# Patient Record
Sex: Female | Born: 1995 | Race: White | Hispanic: No | Marital: Married | Smoking: Never smoker
Health system: Southern US, Community
[De-identification: ages and names within clinical notes are randomized; demographics above are authoritative.]

## PROBLEM LIST (undated history)

## (undated) DIAGNOSIS — Z800 Family history of malignant neoplasm of digestive organs: Secondary | ICD-10-CM

## (undated) DIAGNOSIS — Z23 Encounter for immunization: Secondary | ICD-10-CM

## (undated) DIAGNOSIS — G43109 Migraine with aura, not intractable, without status migrainosus: Secondary | ICD-10-CM

## (undated) DIAGNOSIS — Z803 Family history of malignant neoplasm of breast: Secondary | ICD-10-CM

## (undated) HISTORY — DX: Migraine with aura, not intractable, without status migrainosus: G43.109

## (undated) HISTORY — DX: Family history of malignant neoplasm of breast: Z80.3

## (undated) HISTORY — DX: Encounter for immunization: Z23

## (undated) HISTORY — PX: OTHER SURGICAL HISTORY: SHX169

## (undated) HISTORY — DX: Family history of malignant neoplasm of digestive organs: Z80.0

---
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
## 2020-06-13 DIAGNOSIS — S92344A Nondisplaced fracture of fourth metatarsal bone, right foot, initial encounter for closed fracture: Secondary | ICD-10-CM | POA: Diagnosis not present

## 2020-06-13 DIAGNOSIS — S93699A Other sprain of unspecified foot, initial encounter: Secondary | ICD-10-CM | POA: Diagnosis not present

## 2020-06-20 DIAGNOSIS — L858 Other specified epidermal thickening: Secondary | ICD-10-CM | POA: Diagnosis not present

## 2020-06-20 DIAGNOSIS — L218 Other seborrheic dermatitis: Secondary | ICD-10-CM | POA: Diagnosis not present

## 2020-06-20 DIAGNOSIS — D2261 Melanocytic nevi of right upper limb, including shoulder: Secondary | ICD-10-CM | POA: Diagnosis not present

## 2020-06-20 DIAGNOSIS — D225 Melanocytic nevi of trunk: Secondary | ICD-10-CM | POA: Diagnosis not present

## 2020-06-20 DIAGNOSIS — D485 Neoplasm of uncertain behavior of skin: Secondary | ICD-10-CM | POA: Diagnosis not present

## 2021-02-23 ENCOUNTER — Ambulatory Visit (INDEPENDENT_AMBULATORY_CARE_PROVIDER_SITE_OTHER): Payer: BC Managed Care – PPO | Admitting: Advanced Practice Midwife

## 2021-02-23 ENCOUNTER — Other Ambulatory Visit: Payer: Self-pay

## 2021-02-23 ENCOUNTER — Encounter: Payer: Self-pay | Admitting: Advanced Practice Midwife

## 2021-02-23 VITALS — BP 133/85 | Ht 65.0 in | Wt 172.0 lb

## 2021-02-23 DIAGNOSIS — N6323 Unspecified lump in the left breast, lower outer quadrant: Secondary | ICD-10-CM | POA: Diagnosis not present

## 2021-02-23 NOTE — Progress Notes (Signed)
   Patient ID: Rhonda Stevens, female   DOB: 07/09/1996, 25 y.o.   MRN: [id number]  Reason for Consult: Breast Mass    Subjective:  HPI:  Rhonda Stevens is a 25 y.o. female being seen for left breast lump she noticed in the past week. About 3 weeks ago she had a bruise in her upper left breast that took a while to resolve. Shortly after that she had a bruise in her lower left breast and then noticed a lump near that bruise. She has a dull/achy pain in the left breast. She denies any trauma or a change in bra. She does not have a significant family history of breast or ovarian cancer.  She is sexually active and uses condoms for birth control. She has never had a PAP smear. She did receive Gardasil vaccines. She denies concerns for STDs. Recommend she schedule an annual exam/PAP.   History reviewed. No pertinent past medical history. Family History  Problem Relation Age of Onset  . Colon cancer Maternal Grandfather   . Breast cancer Paternal Grandmother 28  . Breast cancer Maternal Aunt 55   History reviewed. No pertinent surgical history.  Short Social History:  Social History   Tobacco Use  . Smoking status: Never Smoker  . Smokeless tobacco: Never Used  Substance Use Topics  . Alcohol use: Yes    No Known Allergies  No current outpatient medications on file.   No current facility-administered medications for this visit.    Review of Systems  Constitutional: Negative for chills and fever.  HENT: Negative for congestion, ear discharge, ear pain, hearing loss, sinus pain and sore throat.   Eyes: Negative for blurred vision and double vision.  Respiratory: Negative for cough, shortness of breath and wheezing.   Cardiovascular: Negative for chest pain, palpitations and leg swelling.  Gastrointestinal: Negative for abdominal pain, blood in stool, constipation, diarrhea, heartburn, melena, nausea and vomiting.  Genitourinary: Negative for dysuria, flank pain, frequency,  hematuria and urgency.  Musculoskeletal: Negative for back pain, joint pain and myalgias.  Skin: Negative for itching and rash.  Neurological: Negative for dizziness, tingling, tremors, sensory change, speech change, focal weakness, seizures, loss of consciousness, weakness and headaches.  Endo/Heme/Allergies: Negative for environmental allergies. Does not bruise/bleed easily.  Psychiatric/Behavioral: Negative for depression, hallucinations, memory loss, substance abuse and suicidal ideas. The patient is not nervous/anxious and does not have insomnia.   Breast: left breast bruising and lump, pain      Objective:  Objective   Vitals:   02/23/21 1326  BP: 133/85  Weight: 172 lb (78 kg)  Height: 5\' 5"  (1.651 m)   Body mass index is 28.62 kg/m. Constitutional: Well nourished, well developed female in no acute distress.  HEENT: normal Skin: Warm and dry.  Cardiovascular: Regular rate and rhythm.   Breast: right breast; no masses, skin changes, tenderness/left breast; healing bruise in lower breast with ropy/knotty lump at 4:30 o'clock 6 cm from the nipple Extremity: no edema Respiratory: Clear to auscultation bilateral. Normal respiratory effort Neuro: DTRs 2+, Cranial nerves grossly intact Psych: Alert and Oriented x3. No memory deficits. Normal mood and affect.  MS: normal gait, normal bilateral lower extremity ROM/strength/stability.   Assessment/Plan:     25 y.o. G0 P0000 female with breast lump, likely benign  Mammogram/ultrasound ordered Follow up as needed Schedule annual exam with PAP smear   22 CNM [facility] 02/23/2021, 2:03 PM

## 2021-03-02 ENCOUNTER — Other Ambulatory Visit: Payer: Self-pay

## 2021-03-02 ENCOUNTER — Ambulatory Visit
Admission: RE | Admit: 2021-03-02 | Discharge: 2021-03-02 | Disposition: A | Discharge status: Home / Self Care | Payer: BC Managed Care – PPO | Source: Ambulatory Visit | Attending: Advanced Practice Midwife | Admitting: Advanced Practice Midwife

## 2021-03-02 DIAGNOSIS — N6323 Unspecified lump in the left breast, lower outer quadrant: Secondary | ICD-10-CM | POA: Diagnosis not present

## 2021-03-02 DIAGNOSIS — N6321 Unspecified lump in the left breast, upper outer quadrant: Secondary | ICD-10-CM | POA: Diagnosis not present

## 2021-03-12 DIAGNOSIS — Z1371 Encounter for nonprocreative screening for genetic disease carrier status: Secondary | ICD-10-CM

## 2021-03-12 DIAGNOSIS — Z9189 Other specified personal risk factors, not elsewhere classified: Secondary | ICD-10-CM

## 2021-03-12 HISTORY — DX: Encounter for nonprocreative screening for genetic disease carrier status: Z13.71

## 2021-03-12 HISTORY — DX: Other specified personal risk factors, not elsewhere classified: Z91.89

## 2021-03-24 NOTE — Progress Notes (Signed)
PCP:  Pcp, No   Chief Complaint  Patient presents with  . Gynecologic Exam    No concerns     HPI:      [name] is a 25 y.o. No obstetric history on file. whose LMP was Patient's last menstrual period was 03/12/2021 (exact date)., presents today for her annual examination.  Her menses are regular every 30-36 days, lasting 5 days.  Dysmenorrhea mild, occurring first 1-2 days of flow. She does not have intermenstrual bleeding.  Sex activity: single partner, contraception - condoms every time. May be interested in [facility], has not done it before. Hx of migraines with aura; no hx of HTV, DVTs. Last Pap: never Hx of STDs: none  Had LT breast pain and lump 4/22 with bruising, s/p breast u/s with resolving hematoma, repeat due in 3 months There is a FH of breast cancer in her mat aunt and PGM, as well as pancreatic cancer in her MGF.  Genetic testing not done. There is no FH of ovarian cancer. The patient does do self-breast exams.  Tobacco use: The patient denies current or previous tobacco use. Alcohol use: social drinker No drug use.  Exercise: moderately active  She does get adequate calcium and Vitamin D in her diet. Gardasil completed  Past Medical History:  Diagnosis Date  . Migraine with aura   . Vaccine for human papilloma virus (HPV) types 6, 11, 16, and 18 administered     Past Surgical History:  Procedure Laterality Date  . OTHER SURGICAL HISTORY     tooth implant    Family History  Problem Relation Age of Onset  . Pancreatic cancer Maternal Grandfather 12  . Breast cancer Paternal Grandmother 42  . Breast cancer Maternal Aunt 61    Social History   Socioeconomic History  . Marital status: Single    Spouse name: Not on file  . Number of children: Not on file  . Years of education: Not on file  . Highest education level: Not on file  Occupational History  . Not on file  Tobacco Use  . Smoking status: Never Smoker  . Smokeless tobacco: Never  Used  Vaping Use  . Vaping Use: Never used  Substance and Sexual Activity  . Alcohol use: Yes  . Drug use: Never  . Sexual activity: Yes    Birth control/protection: None, Condom  Other Topics Concern  . Not on file  Social History Narrative  . Not on file   Social Determinants of Health   Financial Resource Strain: Not on file  Food Insecurity: Not on file  Transportation Needs: Not on file  Physical Activity: Not on file  Stress: Not on file  Social Connections: Not on file  Intimate Partner Violence: Not on file    No current outpatient medications on file.     ROS:  Review of Systems  Constitutional: Negative for fatigue, fever and unexpected weight change.  Respiratory: Negative for cough, shortness of breath and wheezing.   Cardiovascular: Negative for chest pain, palpitations and leg swelling.  Gastrointestinal: Negative for blood in stool, constipation, diarrhea, nausea and vomiting.  Endocrine: Negative for cold intolerance, heat intolerance and polyuria.  Genitourinary: Negative for dyspareunia, dysuria, flank pain, frequency, genital sores, hematuria, menstrual problem, pelvic pain, urgency, vaginal bleeding, vaginal discharge and vaginal pain.  Musculoskeletal: Negative for back pain, joint swelling and myalgias.  Skin: Negative for rash.  Neurological: Positive for headaches. Negative for dizziness, syncope, light-headedness and numbness.  Hematological: Negative for adenopathy.  Psychiatric/Behavioral: Negative for agitation, confusion, sleep disturbance and suicidal ideas. The patient is not nervous/anxious.   BREAST: No symptoms   Objective: BP 104/76   Ht 5' 5" (1.651 m)   Wt 176 lb (79.8 kg)   LMP 03/12/2021 (Exact Date)   BMI 29.29 kg/m    Physical Exam Constitutional:      Appearance: She is well-developed.  Genitourinary:     Vulva normal.     Right Labia: No rash, tenderness or lesions.    Left Labia: No tenderness, lesions or rash.     No vaginal discharge, erythema or tenderness.      Right Adnexa: not tender and no mass present.    Left Adnexa: not tender and no mass present.    No cervical friability or polyp.     Uterus is not enlarged or tender.  Breasts:     Right: No mass, nipple discharge, skin change or tenderness.     Left: No mass, nipple discharge, skin change or tenderness.    Neck:     Thyroid: No thyromegaly.  Cardiovascular:     Rate and Rhythm: Normal rate and regular rhythm.     Heart sounds: Normal heart sounds. No murmur heard.   Pulmonary:     Effort: Pulmonary effort is normal.     Breath sounds: Normal breath sounds.  Abdominal:     Palpations: Abdomen is soft.     Tenderness: There is no abdominal tenderness. There is no guarding or rebound.  Musculoskeletal:        General: Normal range of motion.     Cervical back: Normal range of motion.  Lymphadenopathy:     Cervical: No cervical adenopathy.  Neurological:     General: No focal deficit present.     Mental Status: She is alert and oriented to person, place, and time.     Cranial Nerves: No cranial nerve deficit.  Skin:    General: Skin is warm and dry.  Psychiatric:        Mood and Affect: Mood normal.        Behavior: Behavior normal.        Thought Content: Thought content normal.        Judgment: Judgment normal.  Vitals reviewed.     Assessment/Plan: Encounter for annual routine gynecological examination  Cervical cancer screening - Plan: Cytology - PAP  Screening for STD (sexually transmitted disease) - Plan: Cytology - PAP  Encounter for other general counseling or advice on contraception--prog only options discussed. Pt may be interested in depo vs nexplanon. Pros/cons discussed. Pt to call if decides on one.   Breast lump on left side at 4 o'clock position - Plan: US BREAST LTD UNI LEFT INC AXILLA; sx resolved. Due for f/u u/s 7/22. Order placed, pt to sched  Family history of breast cancer - Plan:  [facility] ([facility]); MyRisk testing discussed and done today, handout given. Will call with results.         GYN counsel breast self exam, adequate intake of calcium and vitamin D, diet and exercise     F/U  Return in about 1 year (around 03/27/2022), or if symptoms worsen or fail to improve.  Alicia B. Copland, PA-C 03/27/2021 9:02 AM 

## 2021-03-27 ENCOUNTER — Other Ambulatory Visit (HOSPITAL_COMMUNITY)
Admission: RE | Admit: 2021-03-27 | Discharge: 2021-03-27 | Disposition: A | Discharge status: Home / Self Care | Payer: BC Managed Care – PPO | Source: Ambulatory Visit | Attending: Obstetrics and Gynecology | Admitting: Obstetrics and Gynecology

## 2021-03-27 ENCOUNTER — Ambulatory Visit (INDEPENDENT_AMBULATORY_CARE_PROVIDER_SITE_OTHER): Payer: BC Managed Care – PPO | Admitting: Obstetrics and Gynecology

## 2021-03-27 ENCOUNTER — Encounter: Payer: Self-pay | Admitting: Obstetrics and Gynecology

## 2021-03-27 ENCOUNTER — Other Ambulatory Visit: Payer: Self-pay

## 2021-03-27 VITALS — BP 104/76 | Ht 65.0 in | Wt 176.0 lb

## 2021-03-27 DIAGNOSIS — Z113 Encounter for screening for infections with a predominantly sexual mode of transmission: Secondary | ICD-10-CM

## 2021-03-27 DIAGNOSIS — Z124 Encounter for screening for malignant neoplasm of cervix: Secondary | ICD-10-CM | POA: Insufficient documentation

## 2021-03-27 DIAGNOSIS — N6323 Unspecified lump in the left breast, lower outer quadrant: Secondary | ICD-10-CM

## 2021-03-27 DIAGNOSIS — Z3009 Encounter for other general counseling and advice on contraception: Secondary | ICD-10-CM

## 2021-03-27 DIAGNOSIS — Z803 Family history of malignant neoplasm of breast: Secondary | ICD-10-CM

## 2021-03-27 DIAGNOSIS — Z01419 Encounter for gynecological examination (general) (routine) without abnormal findings: Secondary | ICD-10-CM

## 2021-03-27 DIAGNOSIS — Z808 Family history of malignant neoplasm of other organs or systems: Secondary | ICD-10-CM | POA: Diagnosis not present

## 2021-03-27 NOTE — Patient Instructions (Signed)
I value your feedback and you entrusting us with your care. If you get a [facility] patient survey, I would appreciate you taking the time to let us know about your experience today. Thank you!  [facility]: [phone]      

## 2021-03-30 LAB — CYTOLOGY - PAP
Chlamydia: NEGATIVE
Comment: NEGATIVE
Comment: NORMAL
Diagnosis: NEGATIVE
Neisseria Gonorrhea: NEGATIVE

## 2021-04-11 ENCOUNTER — Encounter: Payer: Self-pay | Admitting: Obstetrics and Gynecology

## 2021-04-13 ENCOUNTER — Telehealth: Payer: Self-pay | Admitting: Obstetrics and Gynecology

## 2021-04-13 ENCOUNTER — Encounter: Payer: Self-pay | Admitting: Obstetrics and Gynecology

## 2021-04-13 NOTE — Telephone Encounter (Signed)
Pt aware of neg MyRisk results except SDHA VUS.  IBIS=27.0%/riskscore=30.6%  Pt aware of monthly SBE, yearly CBE, and mammos to start age 25, as well as scr breast MRI. Will re-eval TC/riskscore models at age 8.   Patient understands these results only apply to her and her children, and this is not indicative of genetic testing results of her other family members. It is recommended that her other family members have genetic testing done.  Pt also understands negative genetic testing doesn't mean she will never get any of these cancers.   Hard copy mailed to pt. F/u prn.

## 2021-06-06 ENCOUNTER — Other Ambulatory Visit: Payer: BC Managed Care – PPO

## 2021-07-21 ENCOUNTER — Telehealth: Payer: Self-pay

## 2021-07-21 NOTE — Telephone Encounter (Signed)
Patient is scheduled for nexplanon placement on 07/24/21 with ABC

## 2021-07-24 ENCOUNTER — Ambulatory Visit (INDEPENDENT_AMBULATORY_CARE_PROVIDER_SITE_OTHER): Payer: BC Managed Care – PPO | Admitting: Obstetrics and Gynecology

## 2021-07-24 ENCOUNTER — Other Ambulatory Visit: Payer: Self-pay

## 2021-07-24 ENCOUNTER — Encounter: Payer: Self-pay | Admitting: Obstetrics and Gynecology

## 2021-07-24 VITALS — BP 130/80 | Ht 65.0 in | Wt 178.0 lb

## 2021-07-24 DIAGNOSIS — Z30017 Encounter for initial prescription of implantable subdermal contraceptive: Secondary | ICD-10-CM | POA: Diagnosis not present

## 2021-07-24 MED ORDER — ETONOGESTREL 68 MG ~~LOC~~ IMPL: 1.0000 | DRUG_IMPLANT | Freq: Once | SUBCUTANEOUS | 0 refills | Status: DC

## 2021-07-24 NOTE — Progress Notes (Signed)
   Chief Complaint  Patient presents with   Nexplanon insertion     HPI:  Rhonda Stevens is a 25 y.o. G0P0000 here for Nexplanon insertion for [facility]. Hx of migraines with aura.   BP 130/80   Ht 5\' 5"  (1.651 m)   Wt 178 lb (80.7 kg)   LMP 07/21/2021 (Exact Date)   BMI 29.62 kg/m    Nexplanon Insertion  Patient given informed consent, signed copy in the chart, time out was performed.  Appropriate time out taken.  Patient's RIGHT arm was prepped and draped in the usual sterile fashion. The ruler used to measure and mark insertion area.  Pt was prepped with betadine swab and then injected with 1.0 cc of 2% lidocaine with epinephrine. Nexplanon removed form packaging,  Device confirmed in needle, then inserted full length of needle and withdrawn per handbook instructions.  Pt insertion site covered with steri-strip and a bandage.   Minimal blood loss.  Pt tolerated the procedure welL.  Assessment: Nexplanon insertion - Plan: etonogestrel (NEXPLANON) 68 MG IMPL implant   Meds ordered this encounter  Medications   etonogestrel (NEXPLANON) 68 MG IMPL implant    Sig: 1 each (68 mg total) by Subdermal route once for 1 dose.    Dispense:  1 each    Refill:  0    Order Specific Question:   Supervising Provider    Answer:   09/20/2021 Nadara Mustard    Plan:   She was told to remove the dressing in 12-24 hours, to keep the incision area dry for 24 hours and to remove the Steristrip in 2-3  days.  Notify 10-27-1993 if any signs of tenderness, redness, pain, or fevers develop.   Kimmy Totten B. Lujuana Kapler, PA-C 07/24/2021 9:58 AM

## 2021-07-24 NOTE — Patient Instructions (Signed)
I value your feedback and you entrusting us with your care. If you get a [facility] patient survey, I would appreciate you taking the time to let us know about your experience today. Thank you!  Remove the dressing in 24 hours,  keep the incision area dry for 24 hours and remove the Steristrip in 2-3  days.  Notify us if any signs of tenderness, redness, pain, or fevers develop.   

## 2021-08-16 NOTE — Telephone Encounter (Signed)
Noted. Nexplanon rcvd/charged 07/24/21

## 2022-10-25 ENCOUNTER — Ambulatory Visit: Payer: Self-pay | Admitting: Obstetrics and Gynecology

## 2022-10-31 IMAGING — US US BREAST*L* LIMITED INC AXILLA
1 series · 7 of 7 positions shown · non-contrast
Comparison: Previous exam(s).

CLINICAL DATA: Left breast area of bruising and palpable concern
noted by the patient.

EXAM:
ULTRASOUND OF THE LEFT BREAST

[Series 1: us breast*left* limited inc axilla · 0.06mm/px · 7 of 7 slices shown]
[im 1/7]
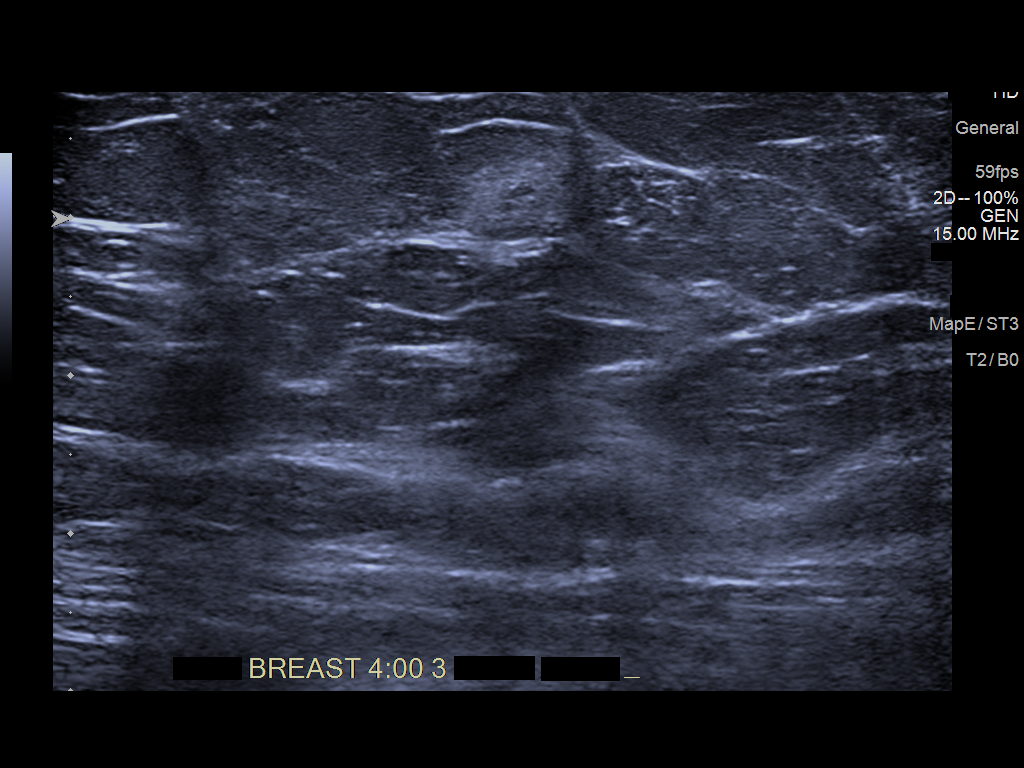
[im 2/7]
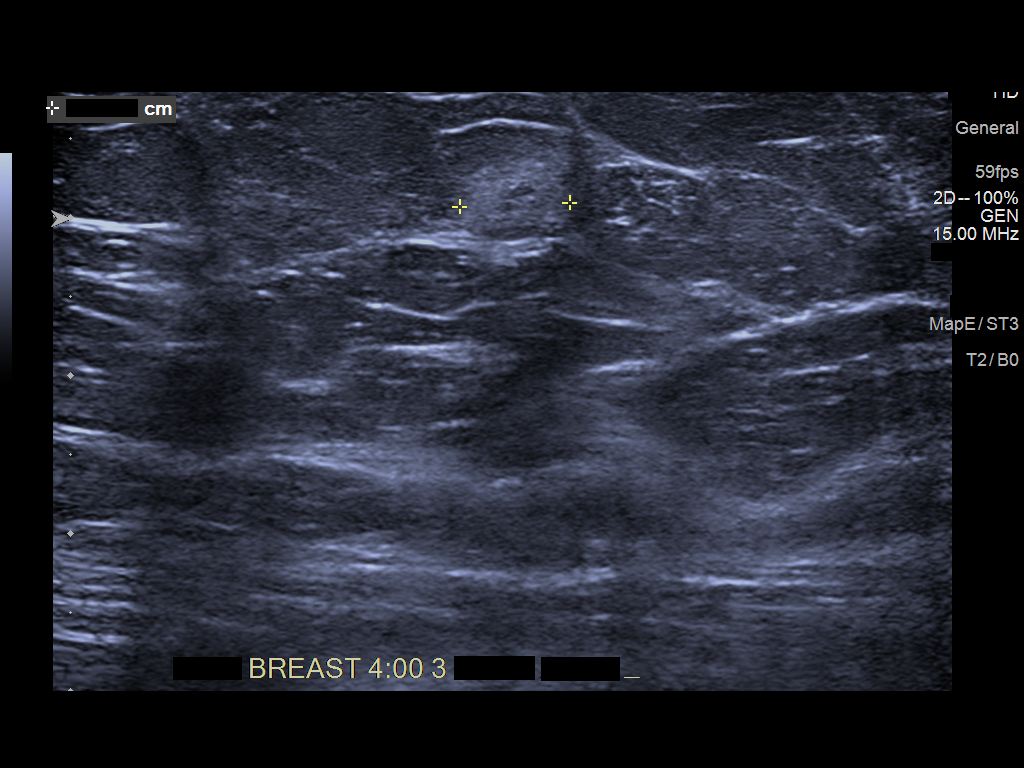
[im 3/7]
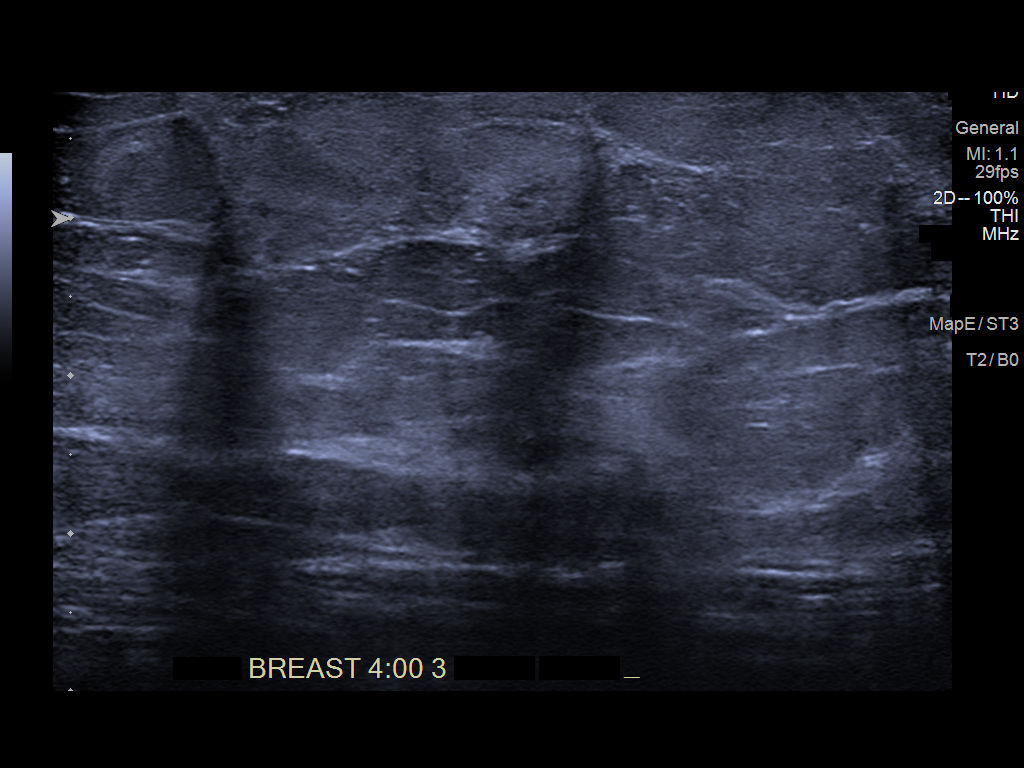
[im 4/7]
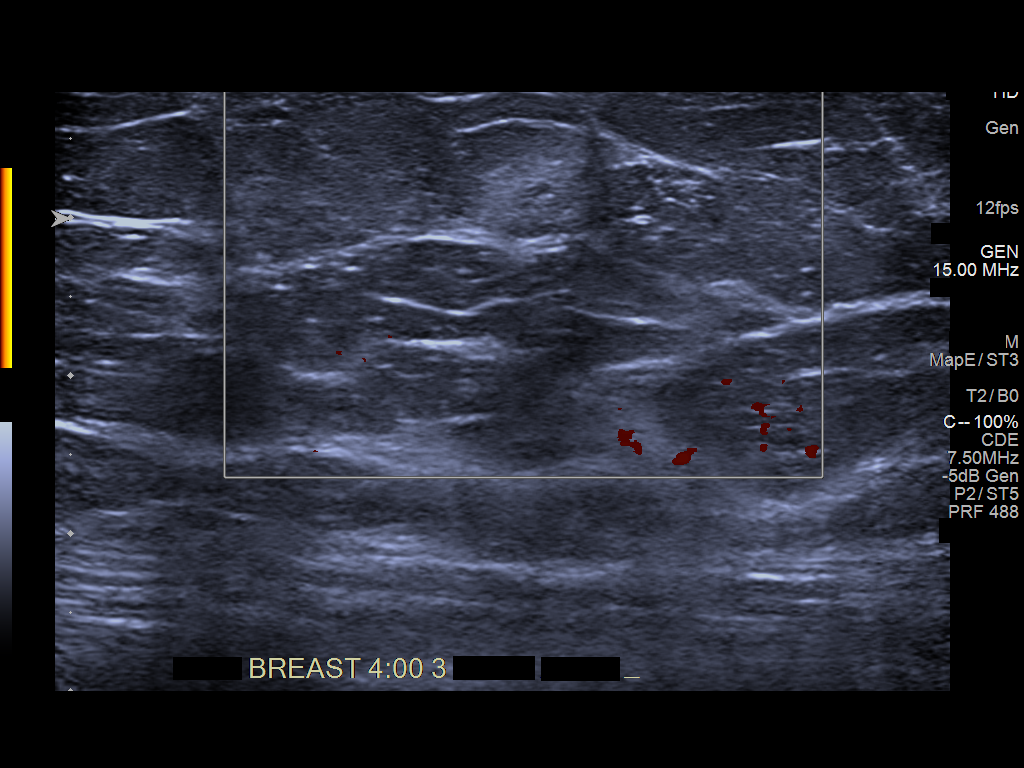
[im 5/7]
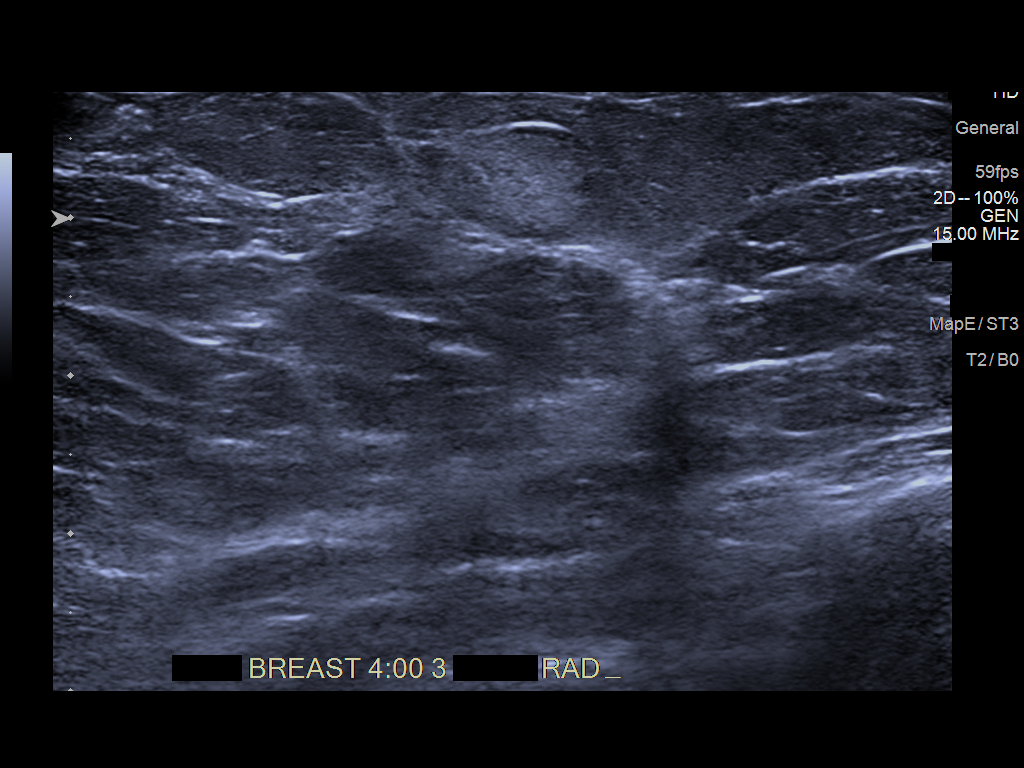
[im 6/7]
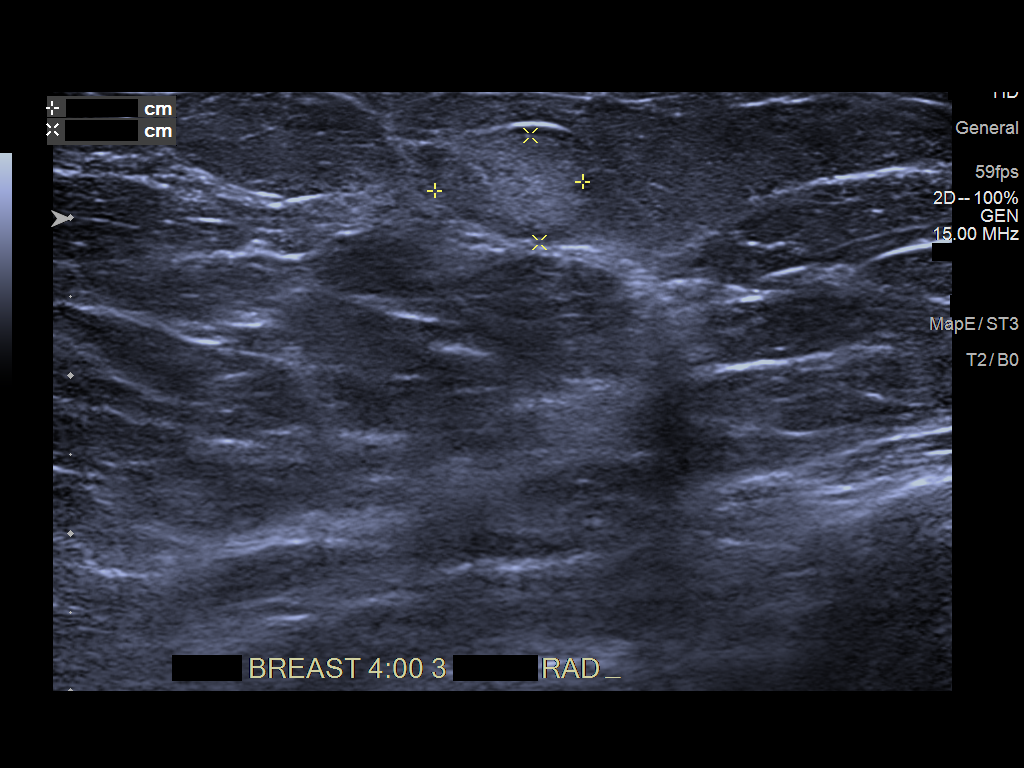
[im 7/7]
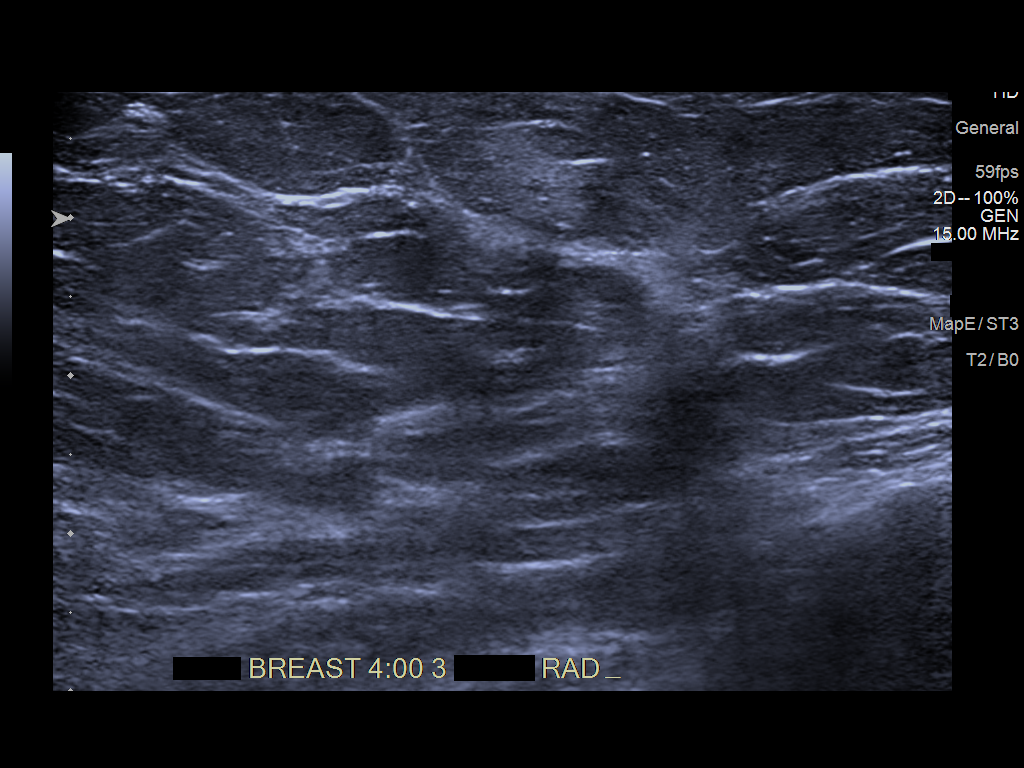

[7 of 7 positions shown; findings below may reference images not displayed]

FINDINGS: On physical exam, mild bruising is seen in the left 4 o'clock breast
with associated less than a cm palpable nodule.

Targeted left breast ultrasound is performed, showing 4 o'clock 3 cm
from the nipple superficially located circumscribed horizontally
oriented predominantly hyperechoic, with hypoechoic center, mass
measuring 0.7 x 0.9 x 0.7 cm. The sonographic findings are
suggestive of organizing breast hematoma.
IMPRESSION: Left breast 4 o'clock probable resolving breast hematoma, for which
short-term imaging follow-up is recommended.

RECOMMENDATION:
Focused left breast ultrasound in 3 months.

I have discussed the findings and recommendations with the patient.
If applicable, a reminder letter will be sent to the patient
regarding the next appointment.

BI-RADS CATEGORY  3: Probably benign.

## 2023-03-06 ENCOUNTER — Telehealth: Payer: Self-pay

## 2023-03-06 NOTE — Telephone Encounter (Signed)
Pt calling to see if she should be seen sooner than appt next month; is having prolonged heavy periods for the past two months.  [phone]  Pt states she is not saturating a pad every 30 minutes to 1hr; last month she bled for 18d and so far this month she's bled for 8d; still has nexplanon in. Adv okay to wait for appt since she is not saturating a pad q82m-1hr and doesn't feel weak or dizzy; does have cramping.  Pt aware to go to ER if she does start saturating a pad q3m-1hr and to be seen if starts to feel weak and or dizzy.

## 2023-03-06 NOTE — Telephone Encounter (Signed)
I am checking with Harriett Sine about using a Same Day spot with Helmut Muster on Monday, April 29th.

## 2023-03-06 NOTE — Telephone Encounter (Signed)
We do have any openings in next 2 to 3 days.  Dr. Logan Bores has a same day appt on April 30th, but she normally sees ABC.

## 2023-03-07 ENCOUNTER — Ambulatory Visit: Payer: BC Managed Care – PPO | Admitting: Medical

## 2023-03-07 ENCOUNTER — Encounter: Payer: Self-pay | Admitting: Medical

## 2023-03-07 ENCOUNTER — Ambulatory Visit (INDEPENDENT_AMBULATORY_CARE_PROVIDER_SITE_OTHER): Payer: BC Managed Care – PPO | Admitting: Medical

## 2023-03-07 VITALS — BP 116/82 | HR 75 | Ht 65.0 in | Wt 166.0 lb

## 2023-03-07 DIAGNOSIS — N926 Irregular menstruation, unspecified: Secondary | ICD-10-CM | POA: Diagnosis not present

## 2023-03-07 DIAGNOSIS — N921 Excessive and frequent menstruation with irregular cycle: Secondary | ICD-10-CM

## 2023-03-07 DIAGNOSIS — Z3046 Encounter for surveillance of implantable subdermal contraceptive: Secondary | ICD-10-CM | POA: Diagnosis not present

## 2023-03-07 NOTE — Progress Notes (Signed)
History:  [name] is a 27 y.o. G0P0000 who presents to clinic today for irregular bleeding. Nexplanon was placed in our office 07/2021. Initially she was having periods about every 3 months and they were light. The last 2 months she has noted prolonged bleeding lasting up to 18 days in a month. She is bleeding currently. She has started to notice fatigue, increased cramping and occasional blood clots.    The following portions of the patient's history were reviewed and updated as appropriate: allergies, current medications, family history, past medical history, social history, past surgical history and problem list.  Review of Systems:  Review of Systems  Constitutional:  Negative for fever and malaise/fatigue.  Gastrointestinal:  Negative for abdominal pain, constipation, diarrhea, nausea and vomiting.  Genitourinary:  Negative for dysuria, frequency and urgency.       Neg - discharge, pelvic pain + vaginal bleeding      Objective:  Physical Exam BP 116/82   Pulse 75   Ht  (1.651 m)   Wt 166 lb (75.3 kg)   LMP 02/26/2023   BMI 27.62 kg/m  Physical Exam Vitals and nursing note reviewed.  Constitutional:      General: She is not in acute distress.    Appearance: Normal appearance. She is well-developed and normal weight.  HENT:     Head: Normocephalic and atraumatic.  Neck:     Thyroid: No thyromegaly.  Cardiovascular:     Rate and Rhythm: Normal rate.  Pulmonary:     Effort: Pulmonary effort is normal.  Abdominal:     General: Abdomen is flat.  Musculoskeletal:     Cervical back: Neck supple.  Skin:    General: Skin is warm and dry.     Findings: No erythema.  Neurological:     Mental Status: She is alert and oriented to person, place, and time.  Psychiatric:        Mood and Affect: Mood normal.    Health Maintenance Due  Topic Date Due   COVID-19 Vaccine (1) Never done   HIV Screening  Never done   Hepatitis C Screening  Never done    DTaP/Tdap/Td (1 - Tdap) Never done    GYNECOLOGY CLINIC PROCEDURE NOTE  Nexplanon Removal Patient was given informed consent for removal of her Nexplanon.  Appropriate time out taken. Nexplanon site identified.  Area prepped in usual sterile fashon. Three ml of 1% lidocaine was used to anesthetize the area at the distal end of the implant. A small stab incision was made right beside the implant on the distal portion.  The Nexplanon rod was grasped using hemostats and removed without difficulty.  There was minimal blood loss. There were no complications.  Steri-strips were applied over the small incision.  A pressure bandage was applied to reduce any bruising.  The patient tolerated the procedure well and was given post procedure instructions.  Patient is planning to use condoms for contraception.   Assessment & Plan:  1.Prolonged periods - Discussed options for stopping breakthrough bleeding with Nexplanon vs removal of Nexplanon - Patient desires removal of Nexplanon today  - Discussed all options for birth control including barrier methods, pills, patch, ring, injection and IUD - Patient would like to use barrier methods for now to allow periods to regulate and will return if she desires another method in the near future.   Return if symptoms worsen or fail to improve.  Marny Lowenstein, PA-C 03/07/2023 3:08 PM

## 2023-03-07 NOTE — Telephone Encounter (Signed)
The patient is scheduled for Rhonda Stevens 4/25 at 3:55.

## 2023-04-04 DIAGNOSIS — Z803 Family history of malignant neoplasm of breast: Secondary | ICD-10-CM | POA: Insufficient documentation

## 2023-04-04 DIAGNOSIS — Z9189 Other specified personal risk factors, not elsewhere classified: Secondary | ICD-10-CM | POA: Insufficient documentation

## 2023-04-04 NOTE — Progress Notes (Signed)
PCP:  Pcp, No   Chief Complaint  Patient presents with   Gynecologic Exam    No concerns     HPI:      [name] is a 27 y.o. No obstetric history on file. whose LMP was Patient's last menstrual period was 03/17/2023 (exact date)., presents today for her annual examination.  Her menses were every 30-36 days, lasting 5 days before Nexplanon. Nexplanon removed 4/24 and no menses since.  Feels like period is coming on.   Sex activity: single partner, contraception - condoms every time. Hx of migraines with aura; no hx of HTV, DVTs. Nexplanon placed 9/22, removed 4/24 due to irregular bleeding. Was also having RLQ pain/RT low back pain with bleeding with nexplanon. Sx improved over past wk.  Last Pap: 03/27/21 Results were no abnormalities Hx of STDs: none  Had LT breast pain and lump 4/22 with bruising, s/p breast u/s with resolving hematoma, repeat due in 3 months. No f/u u/s done; sx resolved.  There is a FH of breast cancer in her mat aunt and PGM, as well as pancreatic cancer in her MGF.  Genetic testing not done. There is no FH of ovarian cancer. The patient does do self-breast exams. Pt is MyRisk neg 5/22; IBIS=27.0%/riskscore=30.6%   Tobacco use: The patient denies current or previous tobacco use. Alcohol use: social drinker No drug use.  Exercise: moderately active  She does get adequate calcium but not Vitamin D in her diet. Gardasil completed  Past Medical History:  Diagnosis Date   BRCA negative 03/2021   MyRisk neg except SDHA VUS   Family history of breast cancer    Family history of pancreatic cancer    Increased risk of breast cancer 03/2021   IBIS=27.0%/riskscore=30.6%   Migraine with aura    Vaccine for human papilloma virus (HPV) types 6, 11, 16, and 18 administered     Past Surgical History:  Procedure Laterality Date   OTHER SURGICAL HISTORY     tooth implant    Family History  Problem Relation Age of Onset   Pancreatic cancer Maternal  Grandfather 7   Breast cancer Paternal Grandmother 57   Breast cancer Maternal Aunt 8    Social History   Socioeconomic History   Marital status: Single    Spouse name: Not on file   Number of children: Not on file   Years of education: Not on file   Highest education level: Not on file  Occupational History   Not on file  Tobacco Use   Smoking status: Never   Smokeless tobacco: Never  Vaping Use   Vaping Use: Never used  Substance and Sexual Activity   Alcohol use: Yes   Drug use: Never   Sexual activity: Yes    Birth control/protection: None, Condom  Other Topics Concern   Not on file  Social History Narrative   Not on file   Social Determinants of Health   Financial Resource Strain: Not on file  Food Insecurity: Not on file  Transportation Needs: Not on file  Physical Activity: Not on file  Stress: Not on file  Social Connections: Not on file  Intimate Partner Violence: Not on file     Current Outpatient Medications:    etonogestrel (NEXPLANON) 68 MG IMPL implant, 1 each (68 mg total) by Subdermal route once for 1 dose., Disp: 1 each, Rfl: 0     ROS:  Review of Systems  Constitutional:  Positive for fatigue. Negative  for fever and unexpected weight change.  Respiratory:  Negative for cough, shortness of breath and wheezing.   Cardiovascular:  Negative for chest pain, palpitations and leg swelling.  Gastrointestinal:  Positive for constipation and diarrhea. Negative for blood in stool, nausea and vomiting.  Endocrine: Negative for cold intolerance, heat intolerance and polyuria.  Genitourinary:  Negative for dyspareunia, dysuria, flank pain, frequency, genital sores, hematuria, menstrual problem, pelvic pain, urgency, vaginal bleeding, vaginal discharge and vaginal pain.  Musculoskeletal:  Negative for back pain, joint swelling and myalgias.  Skin:  Negative for rash.  Neurological:  Positive for headaches. Negative for dizziness, syncope,  light-headedness and numbness.  Hematological:  Negative for adenopathy.  Psychiatric/Behavioral:  Negative for agitation, confusion, sleep disturbance and suicidal ideas. The patient is not nervous/anxious.   BREAST: No symptoms   Objective: BP 114/70   Ht 5\' 5"  (1.651 m)   Wt 168 lb (76.2 kg)   LMP 03/17/2023 (Exact Date)   BMI 27.96 kg/m    Physical Exam Constitutional:      Appearance: She is well-developed.  Genitourinary:     Vulva normal.     Right Labia: No rash, tenderness or lesions.    Left Labia: No tenderness, lesions or rash.    No vaginal discharge, erythema or tenderness.      Right Adnexa: not tender and no mass present.    Left Adnexa: not tender and no mass present.    No cervical friability or polyp.     Uterus is not enlarged or tender.  Breasts:    Right: No mass, nipple discharge, skin change or tenderness.     Left: No mass, nipple discharge, skin change or tenderness.  Neck:     Thyroid: No thyromegaly.  Cardiovascular:     Rate and Rhythm: Normal rate and regular rhythm.     Heart sounds: Normal heart sounds. No murmur heard. Pulmonary:     Effort: Pulmonary effort is normal.     Breath sounds: Normal breath sounds.  Abdominal:     Palpations: Abdomen is soft.     Tenderness: There is no abdominal tenderness. There is no guarding or rebound.  Musculoskeletal:        General: Normal range of motion.     Cervical back: Normal range of motion.  Lymphadenopathy:     Cervical: No cervical adenopathy.  Neurological:     General: No focal deficit present.     Mental Status: She is alert and oriented to person, place, and time.     Cranial Nerves: No cranial nerve deficit.  Skin:    General: Skin is warm and dry.  Psychiatric:        Mood and Affect: Mood normal.        Behavior: Behavior normal.        Thought Content: Thought content normal.        Judgment: Judgment normal.  Vitals reviewed.     Assessment/Plan: Encounter for annual  routine gynecological examination  Encounter for other general counseling or advice on contraception--prog only options discussed. Pt to call if decides on one.   Family history of breast cancer--pt is MyRisk neg.   Increased risk of breast cancer--pt aware of recommendations of monthly SBE, yearly CBE and mammos starting age 31. Will recalculate at age 61 before starting mammos age 70.    GYN counsel breast self exam, adequate intake of calcium and vitamin D, diet and exercise     F/U  Return in  about 1 year (around 04/08/2024).  Dakiyah Heinke B. Taniesha Glanz, PA-C 04/09/2023 12:32 PM

## 2023-04-09 ENCOUNTER — Ambulatory Visit: Payer: BC Managed Care – PPO | Admitting: Obstetrics and Gynecology

## 2023-04-09 ENCOUNTER — Encounter: Payer: Self-pay | Admitting: Obstetrics and Gynecology

## 2023-04-09 VITALS — BP 114/70 | Ht 65.0 in | Wt 168.0 lb

## 2023-04-09 DIAGNOSIS — N6489 Other specified disorders of breast: Secondary | ICD-10-CM

## 2023-04-09 DIAGNOSIS — Z803 Family history of malignant neoplasm of breast: Secondary | ICD-10-CM

## 2023-04-09 DIAGNOSIS — Z01419 Encounter for gynecological examination (general) (routine) without abnormal findings: Secondary | ICD-10-CM | POA: Diagnosis not present

## 2023-04-09 DIAGNOSIS — Z9189 Other specified personal risk factors, not elsewhere classified: Secondary | ICD-10-CM

## 2023-04-09 NOTE — Patient Instructions (Signed)
I value your feedback and you entrusting us with your care. If you get a [facility] patient survey, I would appreciate you taking the time to let us know about your experience today. Thank you!  [facility] ([place])--[phone]  

## 2024-07-26 NOTE — Progress Notes (Signed)
 PCP:  Pcp, No   Chief Complaint  Patient presents with   Gynecologic Exam    Right side pelvic pain and back pain 1-2 weeks before cycle starts x 2-3 months. Dull pelvic pain after intercourse x 2-3 months.     HPI:      [name] is a 28 y.o. No obstetric history on file. whose LMP was Patient's last menstrual period was 07/08/2024 (exact date)., presents today for her annual examination.  Her menses are monthly, lasting 5 days, light to mod flow, no BTB, mild to mod dysmen, improved with tylenol/midol/heating pad. Has dull ache RLQ/RT low back 1-2 wks before menses for several months; no urin, vag, or GI sx. Then has sharp pains for about an hr after sex if in that 1-2 window, but no pain during sex. Had RLQ pain sx a few months on nexplanon  as well but sx resolved. Gets constipation or diarrhea with menses but not 1-2 wks before.   Sex activity: single partner, contraception - condoms/withdrawal; declines BC for now. Conception ok. Not taking PNV.  No bleeding/dryness with sex. Hx of migraines with aura; no hx of HTV, DVTs. Nexplanon  placed 9/22, removed 4/24 due to irregular bleeding.  Last Pap: 03/27/21 Results were no abnormalities Hx of STDs: none  There is a FH of breast cancer in her mat aunt and PGM, as well as pancreatic cancer in her MGF.  There is no FH of ovarian cancer. The patient does do self-breast exams. Pt is MyRisk neg 5/22; IBIS=27.0%/riskscore=30.6%   Tobacco use: The patient denies current or previous tobacco use. Alcohol use: social drinker No drug use.  Exercise: moderately active  She does get adequate calcium but not Vitamin D in her diet. Gardasil completed  Past Medical History:  Diagnosis Date   BRCA negative 03/2021   MyRisk neg except SDHA VUS   Family history of breast cancer    Family history of pancreatic cancer    Increased risk of breast cancer 03/2021   IBIS=27.0%/riskscore=30.6%   Migraine with aura    Vaccine for human  papilloma virus (HPV) types 6, 11, 16, and 18 administered     Past Surgical History:  Procedure Laterality Date   OTHER SURGICAL HISTORY     tooth implant    Family History  Problem Relation Age of Onset   Pancreatic cancer Maternal Grandfather 73   Breast cancer Paternal Grandmother 65   Breast cancer Maternal Aunt 15    Social History   Socioeconomic History   Marital status: Single    Spouse name: Not on file   Number of children: Not on file   Years of education: Not on file   Highest education level: Not on file  Occupational History   Not on file  Tobacco Use   Smoking status: Never   Smokeless tobacco: Never  Vaping Use   Vaping status: Never Used  Substance and Sexual Activity   Alcohol use: Yes    Comment: occ   Drug use: Never   Sexual activity: Yes    Birth control/protection: None, Condom  Other Topics Concern   Not on file  Social History Narrative   Not on file   Social Drivers of Health   Financial Resource Strain: Not on file  Food Insecurity: Not on file  Transportation Needs: Not on file  Physical Activity: Not on file  Stress: Not on file  Social Connections: Not on file  Intimate Partner Violence: Not on  file    No current outpatient medications on file.     ROS:  Review of Systems  Constitutional:  Negative for fatigue, fever and unexpected weight change.  Respiratory:  Negative for cough, shortness of breath and wheezing.   Cardiovascular:  Negative for chest pain, palpitations and leg swelling.  Gastrointestinal:  Negative for blood in stool, constipation, diarrhea, nausea and vomiting.  Endocrine: Negative for cold intolerance, heat intolerance and polyuria.  Genitourinary:  Positive for pelvic pain. Negative for dyspareunia, dysuria, flank pain, frequency, genital sores, hematuria, menstrual problem, urgency, vaginal bleeding, vaginal discharge and vaginal pain.  Musculoskeletal:  Negative for back pain, joint swelling and  myalgias.  Skin:  Negative for rash.  Neurological:  Negative for dizziness, syncope, light-headedness, numbness and headaches.  Hematological:  Negative for adenopathy.  Psychiatric/Behavioral:  Negative for agitation, confusion, sleep disturbance and suicidal ideas. The patient is not nervous/anxious.   BREAST: No symptoms   Objective: BP 108/71   Pulse 83   Ht 5' 5 (1.651 m)   Wt 139 lb (63 kg)   LMP 07/08/2024 (Exact Date)   BMI 23.13 kg/m    Physical Exam Constitutional:      Appearance: She is well-developed.  Genitourinary:     Vulva normal.     Right Labia: No rash, tenderness or lesions.    Left Labia: No tenderness, lesions or rash.    No vaginal discharge, erythema or tenderness.      Right Adnexa: not tender and no mass present.    Left Adnexa: tender.    Left Adnexa: no mass present.    No cervical friability or polyp.     Uterus is not enlarged or tender.  Breasts:    Right: No mass, nipple discharge, skin change or tenderness.     Left: No mass, nipple discharge, skin change or tenderness.  Neck:     Thyroid: No thyromegaly.  Cardiovascular:     Rate and Rhythm: Normal rate and regular rhythm.     Heart sounds: Normal heart sounds. No murmur heard. Pulmonary:     Effort: Pulmonary effort is normal.     Breath sounds: Normal breath sounds.  Abdominal:     Palpations: Abdomen is soft.     Tenderness: There is abdominal tenderness in the right lower quadrant. There is no guarding or rebound.   Musculoskeletal:        General: Normal range of motion.     Cervical back: Normal range of motion.  Lymphadenopathy:     Cervical: No cervical adenopathy.  Neurological:     General: No focal deficit present.     Mental Status: She is alert and oriented to person, place, and time.     Cranial Nerves: No cranial nerve deficit.  Skin:    General: Skin is warm and dry.  Psychiatric:        Mood and Affect: Mood normal.        Behavior: Behavior normal.         Thought Content: Thought content normal.        Judgment: Judgment normal.  Vitals reviewed.     Assessment/Plan: Encounter for annual routine gynecological examination  Cervical cancer screening - Plan: Cytology - PAP  Family history of breast cancer--pt is MyRisk neg.   Increased risk of breast cancer--pt aware of recommendations of monthly SBE, yearly CBE and mammos starting age 29, as well as breast MRI. Will recalculate at age 33 before starting mammos age 74.  Pelvic pain - Plan: US  PELVIS TRANSVAGINAL NON-OB (TV ONLY), tender on exam, check GYN u/s. If WNL, question MSK given exam findings. Start pelvic and low back stretching/exercises in meantime .  Dyspareunia in female - Plan: US  PELVIS TRANSVAGINAL NON-OB (TV ONLY), check GYN u/s, will f/u with results.    GYN counsel breast self exam, adequate intake of calcium and vitamin D, diet and exercise     F/U  Return in about 1 week (around 08/04/2024) for GYN u/s for pelvic pain/dyspareunia--[facility] to call with results.  Abrahm Mancia B. Adalyn Pennock, PA-C 07/28/2024 1:40 PM

## 2024-07-28 ENCOUNTER — Other Ambulatory Visit (HOSPITAL_COMMUNITY)
Admission: RE | Admit: 2024-07-28 | Discharge: 2024-07-28 | Disposition: A | Discharge status: Home / Self Care | Source: Ambulatory Visit | Attending: Certified Nurse Midwife | Admitting: Certified Nurse Midwife

## 2024-07-28 ENCOUNTER — Ambulatory Visit (INDEPENDENT_AMBULATORY_CARE_PROVIDER_SITE_OTHER): Payer: Self-pay | Admitting: Obstetrics and Gynecology

## 2024-07-28 ENCOUNTER — Encounter: Payer: Self-pay | Admitting: Obstetrics and Gynecology

## 2024-07-28 VITALS — BP 108/71 | HR 83 | Ht 65.0 in | Wt 139.0 lb

## 2024-07-28 DIAGNOSIS — Z124 Encounter for screening for malignant neoplasm of cervix: Secondary | ICD-10-CM

## 2024-07-28 DIAGNOSIS — Z01419 Encounter for gynecological examination (general) (routine) without abnormal findings: Secondary | ICD-10-CM

## 2024-07-28 DIAGNOSIS — N941 Unspecified dyspareunia: Secondary | ICD-10-CM

## 2024-07-28 DIAGNOSIS — Z803 Family history of malignant neoplasm of breast: Secondary | ICD-10-CM | POA: Diagnosis not present

## 2024-07-28 DIAGNOSIS — Z9189 Other specified personal risk factors, not elsewhere classified: Secondary | ICD-10-CM

## 2024-07-28 DIAGNOSIS — R102 Pelvic and perineal pain: Secondary | ICD-10-CM | POA: Diagnosis not present

## 2024-07-28 DIAGNOSIS — N921 Excessive and frequent menstruation with irregular cycle: Secondary | ICD-10-CM

## 2024-07-28 NOTE — Patient Instructions (Signed)
 I value your feedback and you entrusting [place] with your care. If you get a [facility] patient survey, I would appreciate you taking the time to let us know about your experience today. Thank you! ? ? ?

## 2024-07-30 LAB — CYTOLOGY - PAP: Diagnosis: NEGATIVE

## 2024-08-10 ENCOUNTER — Telehealth: Payer: Self-pay | Admitting: Obstetrics and Gynecology

## 2024-08-10 ENCOUNTER — Ambulatory Visit (INDEPENDENT_AMBULATORY_CARE_PROVIDER_SITE_OTHER)

## 2024-08-10 DIAGNOSIS — N941 Unspecified dyspareunia: Secondary | ICD-10-CM | POA: Diagnosis not present

## 2024-08-10 DIAGNOSIS — R102 Pelvic and perineal pain: Secondary | ICD-10-CM

## 2024-08-10 NOTE — Telephone Encounter (Addendum)
 Pt aware of GYN u/s results. Neg except small RTO cyst but no findings for sx/RLQ before menses. Cont stretching in case MSK etiology--no changes so far. Also discussed endometriosis. F/u prn.

## 2024-09-14 ENCOUNTER — Ambulatory Visit: Admitting: Nurse Practitioner

## 2024-09-18 ENCOUNTER — Ambulatory Visit (INDEPENDENT_AMBULATORY_CARE_PROVIDER_SITE_OTHER): Admitting: Family Medicine

## 2024-09-18 ENCOUNTER — Encounter: Payer: Self-pay | Admitting: Family Medicine

## 2024-09-18 VITALS — BP 120/88 | HR 89 | Resp 16 | Ht 65.0 in | Wt 141.0 lb

## 2024-09-18 DIAGNOSIS — Z0000 Encounter for general adult medical examination without abnormal findings: Secondary | ICD-10-CM

## 2024-09-18 DIAGNOSIS — G43909 Migraine, unspecified, not intractable, without status migrainosus: Secondary | ICD-10-CM

## 2024-09-18 DIAGNOSIS — Z7689 Persons encountering health services in other specified circumstances: Secondary | ICD-10-CM | POA: Diagnosis not present

## 2024-09-18 DIAGNOSIS — Z23 Encounter for immunization: Secondary | ICD-10-CM | POA: Diagnosis not present

## 2024-09-18 NOTE — Progress Notes (Signed)
 New Patient Office Visit  Subjective   Patient ID: Rhonda Stevens, female    DOB: 1996-06-03  Age: 28 y.o. MRN: [id number]  CC:  Chief Complaint  Patient presents with   Establish Care   Discussed the use of AI scribe software for clinical note transcription with the patient, who gave verbal consent to proceed.  History of Present Illness   Rhonda Stevens is a 29 year old female who presents to establish care with [facility]. She has not had a PCP since her pediatrician.   She experiences migraines affecting the right side of her face, extending from her eye down to her back. Chiropractic treatment has provided significant relief from these symptoms.  She had a Nexplanon  implant removed in 2024 due to prolonged menstrual cycles, which lasted weekly for half a month and were associated with fatigue. Since its removal, she notes a significant improvement in her mood and overall well-being.  She has no current medications and has not seen a primary care doctor since her pediatrician. Her family history includes breast cancer, with her mother diagnosed around age 8. She underwent genetic testing a few years ago and tested negative for BRCA mutations.  No recent tetanus vaccination since 2007.         09/18/2024    8:11 AM  GAD 7 : Generalized Anxiety Score  Nervous, Anxious, on Edge 0  Control/stop worrying 0  Worry too much - different things 0  Trouble relaxing 0  Restless 0  Easily annoyed or irritable 0  Afraid - awful might happen 0  Total GAD 7 Score 0  Anxiety Difficulty Not difficult at all      09/18/2024    8:10 AM  PHQ9 SCORE ONLY  PHQ-9 Total Score 1   No outpatient encounter medications on file as of 09/18/2024.   No facility-administered encounter medications on file as of 09/18/2024.    Patient Active Problem List   Diagnosis Date Noted   Family history of breast cancer 04/04/2023   Increased risk of breast cancer  04/04/2023   Prolonged periods 03/07/2023   Past Medical History:  Diagnosis Date   BRCA negative 03/2021   MyRisk neg except SDHA VUS   Family history of breast cancer    Family history of pancreatic cancer    Increased risk of breast cancer 03/2021   IBIS=27.0%/riskscore=30.6%   Migraine with aura    Vaccine for human papilloma virus (HPV) types 6, 11, 16, and 18 administered    Past Surgical History:  Procedure Laterality Date   OTHER SURGICAL HISTORY     tooth implant   Family History  Problem Relation Age of Onset   Pancreatic cancer Maternal Grandfather 80   Breast cancer Paternal Grandmother 76   Breast cancer Maternal Aunt 7   Social History   Socioeconomic History   Marital status: Married    Spouse name: Not on file   Number of children: Not on file   Years of education: Not on file   Highest education level: Bachelor's degree (e.g., BA, AB, BS)  Occupational History   Not on file  Tobacco Use   Smoking status: Never   Smokeless tobacco: Never  Vaping Use   Vaping status: Never Used  Substance and Sexual Activity   Alcohol use: Yes    Comment: occ   Drug use: Never   Sexual activity: Yes    Birth control/protection: None, Condom  Other Topics Concern  Not on file  Social History Narrative   Not on file   Social Drivers of Health   Financial Resource Strain: Low Risk  (09/16/2024)   Overall Financial Resource Strain (CARDIA)    Difficulty of Paying Living Expenses: Not hard at all  Food Insecurity: No Food Insecurity (09/16/2024)   Hunger Vital Sign    Worried About Running Out of Food in the Last Year: Never true    Ran Out of Food in the Last Year: Never true  Transportation Needs: No Transportation Needs (09/16/2024)   PRAPARE - Administrator, Civil Service (Medical): No    Lack of Transportation (Non-Medical): No  Physical Activity: Insufficiently Active (09/16/2024)   Exercise Vital Sign    Days of Exercise per Week: 4 days     Minutes of Exercise per Session: 30 min  Stress: No Stress Concern Present (09/16/2024)   [employer] of Occupational Health - Occupational Stress Questionnaire    Feeling of Stress: Only a little  Social Connections: Moderately Integrated (09/16/2024)   Social Connection and Isolation Panel    Frequency of Communication with Friends and Family: More than three times a week    Frequency of Social Gatherings with Friends and Family: Once a week    Attends Religious Services: More than 4 times per year    Active Member of [employer] or Organizations: No    Attends Engineer, Structural: Not on file    Marital Status: Married  Catering Manager Violence: Not on file   No outpatient medications prior to visit.   No facility-administered medications prior to visit.   No Known Allergies   ROS: see HPI    Objective   Today's Vitals   09/18/24 0808  BP: 120/88  Pulse: 89  Resp: 16  SpO2: 98%  Weight: 141 lb (64 kg)  Height: 5' 5 (1.651 m)  PainSc: 0-No pain   GENERAL: Well-appearing, in NAD. Well nourished.  SKIN: Pink, warm and dry. No rash, lesion, ulceration, or ecchymoses.  Head: Normocephalic. NECK: Trachea midline. Full ROM w/o pain or tenderness. No lymphadenopathy.  EARS: Tympanic membranes are intact, translucent without bulging and without drainage. Appropriate landmarks visualized.  EYES: Conjunctiva clear without exudates. EOMI, PERRL, no drainage present.  NOSE: Septum midline w/o deformity. Nares patent, mucosa pink and non-inflamed w/o drainage. No sinus tenderness.  THROAT: Uvula midline. Oropharynx clear. Tonsils non-inflamed without exudate. Mucous membranes pink and moist.  RESPIRATORY: Chest wall symmetrical. Respirations even and non-labored. Breath sounds clear to auscultation bilaterally.  CARDIAC: S1, S2 present, regular rate and rhythm without murmur or gallops. Peripheral pulses 2+ bilaterally.  MSK: Muscle tone and strength appropriate for age.  Joints w/o tenderness, redness, or swelling.  EXTREMITIES: Without clubbing, cyanosis, or edema.  NEUROLOGIC: No motor or sensory deficits. Steady, even gait. C2-C12 intact.  PSYCH/MENTAL STATUS: Alert, oriented x 3. Cooperative, appropriate mood and affect.     Assessment & Plan:   1. Encounter to establish care (Primary) Patient is a 31- year-old female who presents today to establish care with primary care at [facility]. Reviewed the past medical history, family history, social history, surgical history, medications and allergies today- updates made as indicated. Patient has no concerns today.   2. Healthcare maintenance Routine wellness visit with no acute concerns. Family history of breast cancer, negative for BRCA mutations. Ordered CBC, thyroid, liver, kidney function tests, electrolytes, A1c, cholesterol panel. - CBC with Differential/Platelet - Comprehensive metabolic panel with GFR - Hemoglobin A1c -  Lipid panel - TSH Rfx on Abnormal to Free T4  3. Need for tetanus, diphtheria, and acellular pertussis (Tdap) vaccine Patient due for Tdap booster. Agreeable to receive immunization today.   4. Migraine without status migrainosus, not intractable, unspecified migraine type Intermittent migraines on right side of face extending to back. Chiropractic care beneficial.      Return if symptoms worsen or fail to improve.   Evalene Arts, FNP

## 2024-09-18 NOTE — Patient Instructions (Signed)

## 2024-09-19 LAB — COMPREHENSIVE METABOLIC PANEL WITH GFR
ALT: 10 IU/L (ref 0–32)
AST: 11 IU/L (ref 0–40)
Albumin: 4.7 g/dL (ref 4.0–5.0)
Alkaline Phosphatase: 43 IU/L (ref 41–116)
BUN/Creatinine Ratio: 16 (ref 9–23)
BUN: 14 mg/dL (ref 6–20)
Bilirubin Total: 0.7 mg/dL (ref 0.0–1.2)
CO2: 25 mmol/L (ref 20–29)
Calcium: 9.5 mg/dL (ref 8.7–10.2)
Chloride: 103 mmol/L (ref 96–106)
Creatinine, Ser: 0.85 mg/dL (ref 0.57–1.00)
Globulin, Total: 2.2 g/dL (ref 1.5–4.5)
Glucose: 82 mg/dL (ref 70–99)
Potassium: 5 mmol/L (ref 3.5–5.2)
Sodium: 140 mmol/L (ref 134–144)
Total Protein: 6.9 g/dL (ref 6.0–8.5)
eGFR: 95 mL/min/1.73 (ref >59)

## 2024-09-19 LAB — CBC WITH DIFFERENTIAL/PLATELET
Basophils Absolute: 0 x10E3/uL (ref 0.0–0.2)
Basos: 1 %
EOS (ABSOLUTE): 0 x10E3/uL (ref 0.0–0.4)
Eos: 1 %
Hematocrit: 44.1 % (ref 34.0–46.6)
Hemoglobin: 14.4 g/dL (ref 11.1–15.9)
Immature Grans (Abs): 0 x10E3/uL (ref 0.0–0.1)
Immature Granulocytes: 0 %
Lymphocytes Absolute: 1.6 x10E3/uL (ref 0.7–3.1)
Lymphs: 32 %
MCH: 30.3 pg (ref 26.6–33.0)
MCHC: 32.7 g/dL (ref 31.5–35.7)
MCV: 93 fL (ref 79–97)
Monocytes Absolute: 0.4 x10E3/uL (ref 0.1–0.9)
Monocytes: 8 %
Neutrophils Absolute: 2.9 x10E3/uL (ref 1.4–7.0)
Neutrophils: 58 %
Platelets: 262 x10E3/uL (ref 150–450)
RBC: 4.76 x10E6/uL (ref 3.77–5.28)
RDW: 12.1 % (ref 11.7–15.4)
WBC: 4.9 x10E3/uL (ref 3.4–10.8)

## 2024-09-19 LAB — LIPID PANEL
Chol/HDL Ratio: 3 ratio (ref 0.0–4.4)
Cholesterol, Total: 181 mg/dL (ref 100–199)
HDL: 61 mg/dL (ref >39)
LDL Chol Calc (NIH): 109 mg/dL — ABNORMAL HIGH (ref 0–99)
Triglycerides: 55 mg/dL (ref 0–149)
VLDL Cholesterol Cal: 11 mg/dL (ref 5–40)

## 2024-09-19 LAB — TSH RFX ON ABNORMAL TO FREE T4: TSH: 1.42 u[IU]/mL (ref 0.450–4.500)

## 2024-09-19 LAB — HEMOGLOBIN A1C
Est. average glucose Bld gHb Est-mCnc: 97 mg/dL
Hgb A1c MFr Bld: 5 % (ref 4.8–5.6)

## 2024-09-21 ENCOUNTER — Ambulatory Visit: Payer: Self-pay | Admitting: Family Medicine

## 2024-11-10 ENCOUNTER — Ambulatory Visit: Admitting: Family Medicine
